# Patient Record
Sex: Male | Born: 2000 | Race: White | Hispanic: No | Marital: Single | Smoking: Former smoker
Health system: Southern US, Community
[De-identification: ages and names within clinical notes are randomized; demographics above are authoritative.]

## PROBLEM LIST (undated history)

## (undated) DIAGNOSIS — T7840XA Allergy, unspecified, initial encounter: Secondary | ICD-10-CM

## (undated) DIAGNOSIS — F909 Attention-deficit hyperactivity disorder, unspecified type: Secondary | ICD-10-CM

## (undated) DIAGNOSIS — T8859XA Other complications of anesthesia, initial encounter: Secondary | ICD-10-CM

## (undated) HISTORY — PX: HYDROCELE EXCISION: SHX482

## (undated) HISTORY — PX: HERNIA REPAIR: SHX51

---
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
## 2019-11-08 ENCOUNTER — Ambulatory Visit (INDEPENDENT_AMBULATORY_CARE_PROVIDER_SITE_OTHER): Payer: Self-pay | Admitting: Family Medicine

## 2019-11-08 ENCOUNTER — Other Ambulatory Visit: Payer: Self-pay

## 2019-11-08 ENCOUNTER — Encounter: Payer: Self-pay | Admitting: Family Medicine

## 2019-11-08 VITALS — BP 120/70 | HR 70 | Ht 74.0 in | Wt 178.0 lb

## 2019-11-08 DIAGNOSIS — Z0000 Encounter for general adult medical examination without abnormal findings: Secondary | ICD-10-CM

## 2019-11-08 NOTE — Progress Notes (Signed)
Date:  11/08/2019   Name:  Adam Gill   DOB:  [birth year]   MRN:  [id number]   Chief Complaint: Annual Exam  Patient is a 19 year old male who presents for a comprehensive physical exam. The patient reports the following problems: none. Health maintenance has been reviewed up to date   No results found for: CREATININE, BUN, NA, K, CL, CO2 No results found for: CHOL, HDL, LDLCALC, LDLDIRECT, TRIG, CHOLHDL No results found for: TSH No results found for: HGBA1C No results found for: WBC, HGB, HCT, MCV, PLT No results found for: ALT, AST, GGT, ALKPHOS, BILITOT   Review of Systems  Constitutional: Negative for chills and fever.  HENT: Negative for drooling, ear discharge, ear pain and sore throat.   Respiratory: Negative for cough, shortness of breath and wheezing.   Cardiovascular: Negative for chest pain, palpitations and leg swelling.  Gastrointestinal: Negative for abdominal pain, blood in stool, constipation, diarrhea and nausea.  Endocrine: Negative for polydipsia.  Genitourinary: Negative for dysuria, frequency, hematuria and urgency.  Musculoskeletal: Negative for back pain, myalgias and neck pain.  Skin: Negative for rash.  Allergic/Immunologic: Negative for environmental allergies.  Neurological: Negative for dizziness and headaches.  Hematological: Does not bruise/bleed easily.  Psychiatric/Behavioral: Negative for suicidal ideas. The patient is not nervous/anxious.     There are no problems to display for this patient.   No Known Allergies  Past Surgical History:  Procedure Laterality Date  . HERNIA REPAIR     AGE 43  . HYDROCELE EXCISION    . SHOULDER ARTHROSCOPY WITH BANKART REPAIR Left 09/23/2016   Procedure: SHOULDER ARTHROSCOPY WITH BANKART REPAIR;  Surgeon: Christena Flake, MD;  Location: [facility] ORS;  Service: Orthopedics;  Laterality: Left;  Marland Kitchen VARICOCELECTOMY  04/2016    Social History   Tobacco Use  . Smoking status: Current Every Day Smoker     Types: E-cigarettes  . Smokeless tobacco: Never Used  Vaping Use  . Vaping Use: Former  Substance Use Topics  . Alcohol use: No  . Drug use: No     Medication list has been reviewed and updated.  No outpatient medications have been marked as taking for the 11/08/19 encounter (Office Visit) with Duanne Limerick, MD.    [facility] 2/9 Scores 11/08/2019 03/24/2017  PHQ - 2 Score 0 0  PHQ- 9 Score 0 0    GAD 7 : Generalized Anxiety Score 11/08/2019  Nervous, Anxious, on Edge 0  Control/stop worrying 0  Worry too much - different things 0  Trouble relaxing 0  Restless 0  Easily annoyed or irritable 0  Afraid - awful might happen 0  Total GAD 7 Score 0  Anxiety Difficulty Not difficult at all    BP Readings from Last 3 Encounters:  11/08/19 120/70  03/24/17 120/70 (52 %, Z = 0.05 /  45 %, Z = -0.12)*  09/23/16 127/79 (77 %, Z = 0.75 /  82 %, Z = 0.91)*   *BP percentiles are based on the 2017 AAP Clinical Practice Guideline for boys    Physical Exam Vitals and nursing note reviewed.  Constitutional:      Appearance: Normal appearance. He is well-developed and well-groomed.  HENT:     Head: Normocephalic.     Jaw: There is normal jaw occlusion.     Right Ear: Hearing, tympanic membrane, ear canal and external ear normal.     Left Ear: Hearing, tympanic membrane, ear canal and external ear normal.  Nose: Nose normal.     Right Turbinates: Not enlarged or swollen.     Left Turbinates: Not enlarged or swollen.     Mouth/Throat:     Lips: Pink.     Mouth: Mucous membranes are moist.     Dentition: Normal dentition.     Tongue: No lesions.     Palate: No mass.     Pharynx: Oropharynx is clear. Uvula midline.  Eyes:     General: Lids are normal. Vision grossly intact. Gaze aligned appropriately. No scleral icterus.       Right eye: No discharge.        Left eye: No discharge.     Extraocular Movements:     Right eye: Normal extraocular motion.     Left eye: Normal  extraocular motion.     Conjunctiva/sclera: Conjunctivae normal.     Pupils: Pupils are equal, round, and reactive to light.     Funduscopic exam:    Right eye: Red reflex present.        Left eye: Red reflex present. Neck:     Thyroid: No thyroid mass, thyromegaly or thyroid tenderness.     Vascular: Normal carotid pulses. No carotid bruit, hepatojugular reflux or JVD.     Trachea: Trachea and phonation normal. No tracheal deviation.  Cardiovascular:     Rate and Rhythm: Normal rate and regular rhythm.     Chest Wall: PMI is not displaced.     Pulses: Normal pulses.          Carotid pulses are 2+ on the right side and 2+ on the left side.      Radial pulses are 2+ on the right side and 2+ on the left side.       Femoral pulses are 2+ on the right side and 2+ on the left side.      Popliteal pulses are 2+ on the right side and 2+ on the left side.       Dorsalis pedis pulses are 2+ on the right side and 2+ on the left side.       Posterior tibial pulses are 2+ on the right side and 2+ on the left side.     Heart sounds: Normal heart sounds, S1 normal and S2 normal. No murmur heard.  No systolic murmur is present.  No diastolic murmur is present.  No friction rub. No gallop. No S3 or S4 sounds.   Pulmonary:     Effort: Pulmonary effort is normal. No respiratory distress.     Breath sounds: Normal breath sounds. No decreased breath sounds, wheezing, rhonchi or rales.  Chest:     Breasts: Breasts are symmetrical.        Right: Normal.        Left: Normal.  Abdominal:     General: Abdomen is flat. Bowel sounds are normal.     Palpations: Abdomen is soft. There is no hepatomegaly, splenomegaly or mass.     Tenderness: There is no abdominal tenderness. There is no guarding or rebound.     Hernia: No hernia is present. There is no hernia in the umbilical area, ventral area, left inguinal area or right inguinal area.  Genitourinary:    Penis: Normal and circumcised.      Testes:         Right: Mass, tenderness, testicular hydrocele or varicocele not present.        Left: Varicocele present. Mass, tenderness or testicular hydrocele not present.  Epididymis:     Right: Normal.     Left: Normal.  Musculoskeletal:        General: No tenderness. Normal range of motion.     Cervical back: Normal, full passive range of motion without pain, normal range of motion and neck supple.     Thoracic back: Normal.     Lumbar back: Normal.     Right lower leg: No edema.     Left lower leg: No edema.     Right foot: Normal range of motion.     Left foot: Normal range of motion.  Feet:     Right foot:     Skin integrity: Skin integrity normal.     Left foot:     Skin integrity: Skin integrity normal.  Lymphadenopathy:     Head:     Right side of head: No submental, submandibular or tonsillar adenopathy.     Left side of head: No submental, submandibular or tonsillar adenopathy.     Cervical: No cervical adenopathy.     Right cervical: No superficial, deep or posterior cervical adenopathy.    Left cervical: No superficial, deep or posterior cervical adenopathy.     Upper Body:     Right upper body: No supraclavicular or axillary adenopathy.     Left upper body: No supraclavicular or axillary adenopathy.     Lower Body: No right inguinal adenopathy. No left inguinal adenopathy.  Skin:    General: Skin is warm.     Capillary Refill: Capillary refill takes less than 2 seconds.     Findings: No rash.  Neurological:     General: No focal deficit present.     Mental Status: He is alert and oriented to person, place, and time.     Cranial Nerves: Cranial nerves are intact. No cranial nerve deficit.     Sensory: Sensation is intact.     Motor: Motor function is intact.     Deep Tendon Reflexes: Reflexes are normal and symmetric.     Reflex Scores:      Tricep reflexes are 2+ on the right side and 2+ on the left side.      Bicep reflexes are 2+ on the right side and 2+ on the left  side.      Brachioradialis reflexes are 2+ on the right side and 2+ on the left side.      Patellar reflexes are 2+ on the right side and 2+ on the left side.      Achilles reflexes are 2+ on the right side and 2+ on the left side. Psychiatric:        Attention and Perception: Attention normal.        Mood and Affect: Mood and affect normal.        Speech: Speech normal.        Behavior: Behavior normal. Behavior is cooperative.        Cognition and Memory: Cognition normal.     Wt Readings from Last 3 Encounters:  11/08/19 178 lb (80.7 kg)  03/24/17 170 lb (77.1 kg) (81 %, Z= 0.89)*  09/23/16 159 lb (72.1 kg) (74 %, Z= 0.64)*   * Growth percentiles are based on CDC (Boys, 2-20 Years) data.    BP 120/70   Pulse 70   Ht 6\' 2"  (1.88 m)   Wt 178 lb (80.7 kg)   BMI 22.85 kg/m   Assessment and Plan: 1. Annual physical exam No subjective/objective concerns noted on  history and physical exam.  Patient's chart was reviewed and there is no concerns noted on previous encounters, most recent labs, most recent imaging, or care everywhere.Adam Gill is a 19 y.o. male who presents today for his Complete Annual Exam. He feels well. He reports exercising . He reports he is sleeping well. Immunizations are reviewed and recommendations provided.   Age appropriate screening tests are discussed. Counseling given for risk factor reduction interventions.  We will obtain a CMP and lipid panel. - Comprehensive metabolic panel - Lipid panel

## 2019-11-09 LAB — COMPREHENSIVE METABOLIC PANEL
ALT: 20 IU/L (ref 0–44)
AST: 29 IU/L (ref 0–40)
Albumin/Globulin Ratio: 1.9 (ref 1.2–2.2)
Albumin: 4.6 g/dL (ref 4.1–5.2)
Alkaline Phosphatase: 82 IU/L (ref 55–125)
BUN/Creatinine Ratio: 11 (ref 9–20)
BUN: 14 mg/dL (ref 6–20)
Bilirubin Total: 0.6 mg/dL (ref 0.0–1.2)
CO2: 27 mmol/L (ref 20–29)
Calcium: 9.8 mg/dL (ref 8.7–10.2)
Chloride: 105 mmol/L (ref 96–106)
Creatinine, Ser: 1.31 mg/dL — ABNORMAL HIGH (ref 0.76–1.27)
GFR calc Af Amer: 90 mL/min/{1.73_m2} (ref >59)
GFR calc non Af Amer: 78 mL/min/{1.73_m2} (ref >59)
Globulin, Total: 2.4 g/dL (ref 1.5–4.5)
Glucose: 71 mg/dL (ref 65–99)
Potassium: 4.1 mmol/L (ref 3.5–5.2)
Sodium: 145 mmol/L — ABNORMAL HIGH (ref 134–144)
Total Protein: 7 g/dL (ref 6.0–8.5)

## 2019-11-09 LAB — LIPID PANEL
Chol/HDL Ratio: 2.9 ratio (ref 0.0–5.0)
Cholesterol, Total: 123 mg/dL (ref 100–199)
HDL: 43 mg/dL (ref >39)
LDL Chol Calc (NIH): 70 mg/dL (ref 0–99)
Triglycerides: 43 mg/dL (ref 0–149)
VLDL Cholesterol Cal: 10 mg/dL (ref 5–40)

## 2021-01-07 ENCOUNTER — Encounter: Payer: Self-pay | Admitting: Family Medicine

## 2021-01-08 ENCOUNTER — Other Ambulatory Visit: Payer: Self-pay

## 2021-01-08 ENCOUNTER — Encounter: Payer: Self-pay | Admitting: Family Medicine

## 2021-01-08 ENCOUNTER — Ambulatory Visit (INDEPENDENT_AMBULATORY_CARE_PROVIDER_SITE_OTHER): Payer: Self-pay | Admitting: Family Medicine

## 2021-01-08 VITALS — BP 133/88 | HR 70 | Ht 74.0 in | Wt 188.6 lb

## 2021-01-08 DIAGNOSIS — Z0000 Encounter for general adult medical examination without abnormal findings: Secondary | ICD-10-CM

## 2021-01-08 NOTE — Progress Notes (Signed)
Date:  01/08/2021   Name:  Adam Gill   DOB:  [birth year]   MRN:  [id number]   Chief Complaint: No chief complaint on file.  Patient is a 20 year old male who presents for a comprehensive physical exam. The patient reports the following problems: none. Health maintenance has been reviewed up to date.Adam Gill is a 20 y.o. male who presents today for his Complete Annual Exam. He feels well. He reports exercising every day. He reports he is sleeping well.       Lab Results  Component Value Date   CREATININE 1.31 (H) 11/08/2019   BUN 14 11/08/2019   NA 145 (H) 11/08/2019   K 4.1 11/08/2019   CL 105 11/08/2019   CO2 27 11/08/2019   Lab Results  Component Value Date   CHOL 123 11/08/2019   HDL 43 11/08/2019   LDLCALC 70 11/08/2019   TRIG 43 11/08/2019   CHOLHDL 2.9 11/08/2019   No results found for: TSH No results found for: HGBA1C No results found for: WBC, HGB, HCT, MCV, PLT Lab Results  Component Value Date   ALT 20 11/08/2019   AST 29 11/08/2019   ALKPHOS 82 11/08/2019   BILITOT 0.6 11/08/2019     Review of Systems  Constitutional:  Negative for chills and fever.  HENT:  Positive for hearing loss. Negative for drooling, ear discharge, ear pain and sore throat.   Eyes:  Negative for visual disturbance.  Respiratory:  Negative for cough, shortness of breath and wheezing.   Cardiovascular:  Negative for chest pain, palpitations and leg swelling.  Gastrointestinal:  Negative for abdominal pain, blood in stool, constipation, diarrhea and nausea.  Endocrine: Negative for polydipsia.  Genitourinary:  Negative for dysuria, frequency, hematuria and urgency.  Musculoskeletal:  Negative for back pain, myalgias and neck pain.  Skin:  Negative for rash.  Allergic/Immunologic: Negative for environmental allergies.  Neurological:  Negative for dizziness and headaches.  Hematological:  Negative for adenopathy. Does not bruise/bleed easily.  Psychiatric/Behavioral:   Negative for suicidal ideas. The patient is not nervous/anxious.    There are no problems to display for this patient.   No Known Allergies  Past Surgical History:  Procedure Laterality Date   HERNIA REPAIR     AGE 58   HYDROCELE EXCISION     SHOULDER ARTHROSCOPY WITH BANKART REPAIR Left 09/23/2016   Procedure: SHOULDER ARTHROSCOPY WITH BANKART REPAIR;  Surgeon: Christena Flake, MD;  Location: [facility] ORS;  Service: Orthopedics;  Laterality: Left;   VARICOCELECTOMY  04/2016    Social History   Tobacco Use   Smoking status: Every Day    Types: E-cigarettes   Smokeless tobacco: Never  Vaping Use   Vaping Use: Former  Substance Use Topics   Alcohol use: No   Drug use: No     Medication list has been reviewed and updated.  No outpatient medications have been marked as taking for the 01/08/21 encounter (Appointment) with Duanne Limerick, MD.    [facility] 2/9 Scores 11/08/2019 03/24/2017  PHQ - 2 Score 0 0  PHQ- 9 Score 0 0    GAD 7 : Generalized Anxiety Score 11/08/2019  Nervous, Anxious, on Edge 0  Control/stop worrying 0  Worry too much - different things 0  Trouble relaxing 0  Restless 0  Easily annoyed or irritable 0  Afraid - awful might happen 0  Total GAD 7 Score 0  Anxiety Difficulty Not difficult at all  BP Readings from Last 3 Encounters:  11/08/19 120/70  03/24/17 120/70 (54 %, Z = 0.10 /  48 %, Z = -0.05)*  09/23/16 127/79 (78 %, Z = 0.77 /  83 %, Z = 0.95)*   *BP percentiles are based on the 2017 AAP Clinical Practice Guideline for boys    Physical Exam Vitals and nursing note reviewed.  Constitutional:      Appearance: He is well-developed, well-groomed and normal weight.  HENT:     Head: Normocephalic.     Jaw: There is normal jaw occlusion.     Right Ear: Hearing, tympanic membrane, ear canal and external ear normal.     Left Ear: Hearing, tympanic membrane, ear canal and external ear normal.     Nose: Nose normal. No congestion or rhinorrhea.      Right Turbinates: Not enlarged.     Left Turbinates: Not enlarged.  Eyes:     General: Lids are normal. Vision grossly intact. Gaze aligned appropriately. No scleral icterus.       Right eye: No discharge.        Left eye: No discharge.     Conjunctiva/sclera: Conjunctivae normal.     Pupils: Pupils are equal, round, and reactive to light.     Funduscopic exam:    Right eye: Red reflex present.        Left eye: Red reflex present. Neck:     Thyroid: No thyroid mass, thyromegaly or thyroid tenderness.     Vascular: Normal carotid pulses. No carotid bruit, hepatojugular reflux or JVD.     Trachea: Trachea normal. No tracheal tenderness or tracheal deviation.  Cardiovascular:     Rate and Rhythm: Normal rate and regular rhythm.     Chest Wall: PMI is not displaced.     Pulses: Normal pulses.          Carotid pulses are 2+ on the right side and 2+ on the left side.      Radial pulses are 2+ on the right side and 2+ on the left side.       Femoral pulses are 2+ on the right side and 2+ on the left side.      Popliteal pulses are 2+ on the right side and 2+ on the left side.       Dorsalis pedis pulses are 2+ on the right side and 2+ on the left side.       Posterior tibial pulses are 2+ on the right side and 2+ on the left side.     Heart sounds: Normal heart sounds, S1 normal and S2 normal. No murmur heard. No systolic murmur is present.  No diastolic murmur is present.    No friction rub. No gallop. No S3 or S4 sounds.  Pulmonary:     Effort: Pulmonary effort is normal. No respiratory distress.     Breath sounds: Normal breath sounds and air entry. No decreased breath sounds, wheezing, rhonchi or rales.  Chest:  Breasts:    Right: Normal. No swelling, bleeding, inverted nipple, mass, nipple discharge, skin change or tenderness.     Left: Normal. No swelling, bleeding, inverted nipple, mass, nipple discharge, skin change or tenderness.  Abdominal:     General: Bowel sounds are  normal.     Palpations: Abdomen is soft. There is no hepatomegaly, splenomegaly or mass.     Tenderness: There is no abdominal tenderness. There is no guarding or rebound.     Hernia: No hernia  is present. There is no hernia in the umbilical area, ventral area, left inguinal area or right inguinal area.  Genitourinary:    Testes:        Right: Varicocele present. Mass, tenderness, swelling or testicular hydrocele not present.        Left: Mass, tenderness, swelling, testicular hydrocele or varicocele not present.     Epididymis:     Right: Enlarged. Tenderness present. No mass.     Left: Normal. No mass.     Comments: Right testes elevated Musculoskeletal:        General: No tenderness. Normal range of motion.     Cervical back: Normal, full passive range of motion without pain, normal range of motion and neck supple.     Thoracic back: Normal.     Lumbar back: Normal.     Right lower leg: No edema.     Left lower leg: No edema.  Lymphadenopathy:     Head:     Right side of head: No submental, submandibular or tonsillar adenopathy.     Left side of head: No submental, submandibular or tonsillar adenopathy.     Cervical: No cervical adenopathy.     Right cervical: No superficial, deep or posterior cervical adenopathy.    Left cervical: No superficial, deep or posterior cervical adenopathy.     Upper Body:     Right upper body: No supraclavicular, axillary or pectoral adenopathy.     Left upper body: No supraclavicular, axillary or pectoral adenopathy.     Lower Body: No right inguinal adenopathy. No left inguinal adenopathy.  Skin:    General: Skin is warm.     Capillary Refill: Capillary refill takes less than 2 seconds.     Findings: No bruising, lesion or rash.  Neurological:     Mental Status: He is alert and oriented to person, place, and time.     Cranial Nerves: Cranial nerves are intact. No cranial nerve deficit.     Sensory: Sensation is intact.     Motor: Motor function  is intact.     Deep Tendon Reflexes: Reflexes are normal and symmetric.  Psychiatric:        Behavior: Behavior is cooperative.    Wt Readings from Last 3 Encounters:  11/08/19 178 lb (80.7 kg)  03/24/17 170 lb (77.1 kg) (81 %, Z= 0.89)*  09/23/16 159 lb (72.1 kg) (74 %, Z= 0.64)*   * Growth percentiles are based on CDC (Boys, 2-20 Years) data.    There were no vitals taken for this visit.  Assessment and Plan: Adam Gill is a 20 y.o. male who presents today for his Complete Annual Exam. He feels well. He reports exercising every day. He reports he is sleeping well.  Patient's chart was reviewed for previous encounters most recent labs most recent imaging in Care Everywhere. 1. Annual physical exam Immunizations are reviewed and recommendations provided.   Age appropriate screening tests are discussed. Counseling given for risk factor reduction interventions.  No subjective/objective concerns noted during HPI/review of systems/physical exam other than urologic concerns addressed noted.  Will obtain CMP lipid panel HIV and hep C titers. - Comprehensive metabolic panel. - Lipid panel - HIV Antibody (routine testing w rflx) - Hepatitis C antibody

## 2021-01-09 LAB — COMPREHENSIVE METABOLIC PANEL
ALT: 18 IU/L (ref 0–44)
AST: 29 IU/L (ref 0–40)
Albumin/Globulin Ratio: 2.3 — ABNORMAL HIGH (ref 1.2–2.2)
Albumin: 4.9 g/dL (ref 4.1–5.2)
Alkaline Phosphatase: 97 IU/L (ref 44–121)
BUN/Creatinine Ratio: 16 (ref 9–20)
BUN: 21 mg/dL — ABNORMAL HIGH (ref 6–20)
Bilirubin Total: 0.7 mg/dL (ref 0.0–1.2)
CO2: 26 mmol/L (ref 20–29)
Calcium: 9.7 mg/dL (ref 8.7–10.2)
Chloride: 103 mmol/L (ref 96–106)
Creatinine, Ser: 1.31 mg/dL — ABNORMAL HIGH (ref 0.76–1.27)
Globulin, Total: 2.1 g/dL (ref 1.5–4.5)
Glucose: 69 mg/dL (ref 65–99)
Potassium: 4.4 mmol/L (ref 3.5–5.2)
Sodium: 142 mmol/L (ref 134–144)
Total Protein: 7 g/dL (ref 6.0–8.5)
eGFR: 79 mL/min/{1.73_m2} (ref >59)

## 2021-01-09 LAB — LIPID PANEL
Chol/HDL Ratio: 3.2 ratio (ref 0.0–5.0)
Cholesterol, Total: 119 mg/dL (ref 100–199)
HDL: 37 mg/dL — ABNORMAL LOW (ref >39)
LDL Chol Calc (NIH): 58 mg/dL (ref 0–99)
Triglycerides: 138 mg/dL (ref 0–149)
VLDL Cholesterol Cal: 24 mg/dL (ref 5–40)

## 2021-01-09 LAB — HEPATITIS C ANTIBODY: Hep C Virus Ab: <0.1 s/co ratio (ref 0.0–0.9)

## 2021-01-09 LAB — HIV ANTIBODY (ROUTINE TESTING W REFLEX): HIV Screen 4th Generation wRfx: NONREACTIVE

## 2021-06-23 ENCOUNTER — Encounter: Payer: Self-pay | Admitting: Family Medicine

## 2021-06-23 ENCOUNTER — Other Ambulatory Visit: Payer: Self-pay

## 2021-06-23 ENCOUNTER — Ambulatory Visit (INDEPENDENT_AMBULATORY_CARE_PROVIDER_SITE_OTHER): Admitting: Family Medicine

## 2021-06-23 VITALS — BP 130/86 | HR 71 | Resp 98 | Ht 74.0 in | Wt 186.0 lb

## 2021-06-23 DIAGNOSIS — I861 Scrotal varices: Secondary | ICD-10-CM | POA: Diagnosis not present

## 2021-06-23 NOTE — Progress Notes (Signed)
Date:  06/23/2021   Name:  Adam Gill   DOB:  2000-06-16   MRN:  [id number]   Chief Complaint: Testicle Pain (Left Side. Getting Worse. )  Testicle Pain The patient's primary symptoms include scrotal swelling and testicular pain. The patient's pertinent negatives include no genital injury, genital lesions, penile discharge or penile pain. This is a recurrent problem. The current episode started more than 1 year ago (about a year). The problem occurs constantly. The problem has been waxing and waning. The pain is severe. Pertinent negatives include no abdominal pain, anorexia, chest pain, chills, constipation, coughing, diarrhea, dysuria, fever, flank pain, frequency, headaches, nausea, painful intercourse, rash, shortness of breath, sore throat or urgency. The testicular pain affects the left testicle. There is swelling in the left testicle. The symptoms are aggravated by activity and tactile pressure. Treatments tried: surgery x 2/ The treatment provided mild relief.   Lab Results  Component Value Date   NA 142 01/08/2021   K 4.4 01/08/2021   CO2 26 01/08/2021   GLUCOSE 69 01/08/2021   BUN 21 (H) 01/08/2021   CREATININE 1.31 (H) 01/08/2021   CALCIUM 9.7 01/08/2021   EGFR 79 01/08/2021   GFRNONAA 78 11/08/2019   Lab Results  Component Value Date   CHOL 119 01/08/2021   HDL 37 (L) 01/08/2021   LDLCALC 58 01/08/2021   TRIG 138 01/08/2021   CHOLHDL 3.2 01/08/2021   No results found for: TSH No results found for: HGBA1C No results found for: WBC, HGB, HCT, MCV, PLT Lab Results  Component Value Date   ALT 18 01/08/2021   AST 29 01/08/2021   ALKPHOS 97 01/08/2021   BILITOT 0.7 01/08/2021   No results found for: 25OHVITD2, 25OHVITD3, VD25OH   Review of Systems  Constitutional:  Negative for chills and fever.  HENT:  Negative for drooling, ear discharge, ear pain and sore throat.   Respiratory:  Negative for cough, shortness of breath and wheezing.   Cardiovascular:   Negative for chest pain, palpitations and leg swelling.  Gastrointestinal:  Negative for abdominal pain, anorexia, blood in stool, constipation, diarrhea and nausea.  Endocrine: Negative for polydipsia.  Genitourinary:  Positive for scrotal swelling and testicular pain. Negative for difficulty urinating, dysuria, enuresis, flank pain, frequency, genital sores, hematuria, penile discharge, penile pain, penile swelling and urgency.  Musculoskeletal:  Negative for back pain, myalgias and neck pain.  Skin:  Negative for rash.  Allergic/Immunologic: Negative for environmental allergies.  Neurological:  Negative for dizziness and headaches.  Hematological:  Does not bruise/bleed easily.  Psychiatric/Behavioral:  Negative for suicidal ideas. The patient is not nervous/anxious.    There are no problems to display for this patient.   No Known Allergies  Past Surgical History:  Procedure Laterality Date   HERNIA REPAIR     AGE 53   HYDROCELE EXCISION     SHOULDER ARTHROSCOPY WITH BANKART REPAIR Left 09/23/2016   Procedure: SHOULDER ARTHROSCOPY WITH BANKART REPAIR;  Surgeon: Corky Mull, MD;  Location: [facility] ORS;  Service: Orthopedics;  Laterality: Left;   VARICOCELECTOMY  04/2016    Social History   Tobacco Use   Smoking status: Former    Types: E-cigarettes   Smokeless tobacco: Never  Vaping Use   Vaping Use: Former  Substance Use Topics   Alcohol use: No   Drug use: No     Medication list has been reviewed and updated.  No outpatient medications have been marked as taking for the 06/23/21  encounter (Office Visit) with Juline Patch, MD.    [facility] 2/9 Scores 06/23/2021 01/08/2021 11/08/2019 03/24/2017  PHQ - 2 Score 0 0 0 0  PHQ- 9 Score 3 0 0 0    GAD 7 : Generalized Anxiety Score 06/23/2021 01/08/2021 11/08/2019  Nervous, Anxious, on Edge 0 0 0  Control/stop worrying 0 0 0  Worry too much - different things 0 0 0  Trouble relaxing 0 0 0  Restless 0 0 0  Easily annoyed or  irritable 0 0 0  Afraid - awful might happen 0 0 0  Total GAD 7 Score 0 0 0  Anxiety Difficulty Not difficult at all Not difficult at all Not difficult at all    BP Readings from Last 3 Encounters:  06/23/21 130/86  01/08/21 133/88  11/08/19 120/70    Physical Exam Vitals and nursing note reviewed.  HENT:     Head: Normocephalic.     Right Ear: Tympanic membrane and external ear normal.     Left Ear: Tympanic membrane and external ear normal.     Nose: Nose normal. No congestion or rhinorrhea.  Eyes:     General: No scleral icterus.       Right eye: No discharge.        Left eye: No discharge.     Conjunctiva/sclera: Conjunctivae normal.     Pupils: Pupils are equal, round, and reactive to light.  Neck:     Thyroid: No thyromegaly.     Vascular: No JVD.     Trachea: No tracheal deviation.  Cardiovascular:     Rate and Rhythm: Normal rate and regular rhythm.     Heart sounds: Normal heart sounds. No murmur heard.   No friction rub. No gallop.  Pulmonary:     Effort: No respiratory distress.     Breath sounds: Normal breath sounds. No wheezing, rhonchi or rales.  Abdominal:     General: Bowel sounds are normal.     Palpations: Abdomen is soft. There is no mass.     Tenderness: There is no abdominal tenderness. There is no guarding or rebound.  Genitourinary:    Testes:        Right: Varicocele not present.        Left: Tenderness, swelling and varicocele present. Mass not present.  Musculoskeletal:        General: No tenderness. Normal range of motion.     Cervical back: Normal range of motion and neck supple.  Lymphadenopathy:     Cervical: No cervical adenopathy.  Skin:    General: Skin is warm.     Findings: No bruising, erythema or rash.  Neurological:     Mental Status: He is alert and oriented to person, place, and time.     Cranial Nerves: No cranial nerve deficit.     Deep Tendon Reflexes: Reflexes are normal and symmetric.    Wt Readings from Last 3  Encounters:  06/23/21 186 lb (84.4 kg)  01/08/21 188 lb 9.6 oz (85.5 kg)  11/08/19 178 lb (80.7 kg)    BP 130/86    Pulse 71    Resp (!) 98    Ht _0  (1.88 m)    Wt 186 lb (84.4 kg)    BMI 23.88 kg/m   Assessment and Plan:  1. Varicocele New reoccurrence.  Persistent.  Degree of pain 8/10.  Over the past year this is gradually increased to the point that there is no resolution and that  pain has unresolvable with nonsteroidal anti-inflammatory.  Patient has had 2 previous surgeries and this is a recurrence of the original condition.  We will refer to urology for evaluation and treatment thereof.  In the meantime continue nonsteroidal anti-inflammatory and compression will also help with jockey like underwear. - Ambulatory referral to Urology

## 2021-07-24 ENCOUNTER — Other Ambulatory Visit: Payer: Self-pay | Admitting: Urology

## 2021-07-24 DIAGNOSIS — I861 Scrotal varices: Secondary | ICD-10-CM

## 2021-07-24 DIAGNOSIS — N50812 Left testicular pain: Secondary | ICD-10-CM

## 2021-07-28 ENCOUNTER — Other Ambulatory Visit: Payer: Self-pay

## 2021-07-30 ENCOUNTER — Other Ambulatory Visit: Payer: Self-pay

## 2021-08-03 ENCOUNTER — Other Ambulatory Visit: Payer: Self-pay

## 2021-08-18 ENCOUNTER — Inpatient Hospital Stay: Admission: RE | Admit: 2021-08-18 | Payer: Self-pay | Source: Ambulatory Visit

## 2021-08-18 HISTORY — DX: Other complications of anesthesia, initial encounter: T88.59XA

## 2021-08-20 ENCOUNTER — Ambulatory Visit
Admission: RE | Admit: 2021-08-20 | Discharge: 2021-08-20 | Disposition: A | Discharge status: Home / Self Care | Source: Ambulatory Visit | Attending: Urology | Admitting: Urology

## 2021-08-20 DIAGNOSIS — I861 Scrotal varices: Secondary | ICD-10-CM

## 2021-08-20 DIAGNOSIS — N50812 Left testicular pain: Secondary | ICD-10-CM

## 2021-08-20 NOTE — Consult Note (Signed)
? ?Chief Complaint: ?Patient was seen in consultation today for left varicocele ? ?Referring Physician(s): ?Machen,Graham L ? ?History of Present Illness: ?Adam Gill is a 21 y.o. male with history of left varicocele status post prior surgical interventions at age 23 and age 84.  He states that neither of these were successful, and he has had persistent left testicular pain and swelling since age 85.  The pain comes and goes.  He endorses worsening of symptoms when working out heavily including squats.  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  He has not tried anything to relieve the symptoms except his prior operations.  He has never had a semen analysis.  He denies any scrotal skin discoloration.  He has never had a semen analysis performed, but is motivated to have this prior to any further intervention.  He denies any prior imaging of his scrotum or abdomen/pelvis. ? ?Past Medical History:  ?Diagnosis Date  ? ADHD (attention deficit hyperactivity disorder)   ? Allergy   ? Complication of anesthesia   ? ? ?Past Surgical History:  ?Procedure Laterality Date  ? HERNIA REPAIR    ? AGE 10  ? HYDROCELE EXCISION    ? SHOULDER ARTHROSCOPY WITH BANKART REPAIR Left 09/23/2016  ? Procedure: SHOULDER ARTHROSCOPY WITH BANKART REPAIR;  Surgeon: Christena Flake, MD;  Location: [facility] ORS;  Service: Orthopedics;  Laterality: Left;  ? VARICOCELECTOMY  04/2016  ? ? ?Allergies: ?Patient has no known allergies. ? ?Medications: ?Prior to Admission medications   ?Not on File  ?  ? ?No family history on file. ? ?Social History  ? ?Socioeconomic History  ? Marital status: Single  ?  Spouse name: Not on file  ? Number of children: Not on file  ? Years of education: Not on file  ? Highest education level: Not on file  ?Occupational History  ? Not on file  ?Tobacco Use  ? Smoking status: Former  ?  Types: E-cigarettes  ? Smokeless tobacco: Never  ?Vaping Use  ? Vaping Use: Former   ?Substance and Sexual Activity  ? Alcohol use: No  ? Drug use: No  ? Sexual activity: Not on file  ?Other Topics Concern  ? Not on file  ?Social History Narrative  ? Not on file  ? ?Social Determinants of Health  ? ?Financial Resource Strain: Not on file  ?Food Insecurity: Not on file  ?Transportation Needs: Not on file  ?Physical Activity: Not on file  ?Stress: Not on file  ?Social Connections: Not on file  ? ? ?Review of Systems: A 12 point ROS discussed and pertinent positives are indicated in the HPI above.  All other systems are negative. ? ?Vital Signs: ?BP 122/69 (BP Location: Right Arm)   Pulse 69   Temp 98.1 ?F (36.7 ?C) (Oral)   Resp 16   Wt 86.2 kg   SpO2 96%   BMI 24.39 kg/m?  ? ?Physical Exam ?Constitutional:   ?   General: He is not in acute distress. ?HENT:  ?   Head: Normocephalic.  ?   Mouth/Throat:  ?   Mouth: Mucous membranes are moist.  ?Cardiovascular:  ?   Rate and Rhythm: Normal rate and regular rhythm.  ?   Heart sounds: Normal heart sounds.  ?Pulmonary:  ?   Breath sounds: Normal breath sounds.  ?Abdominal:  ?   General: There is no distension.  ?Skin: ?   General: Skin  is warm and dry.  ?Neurological:  ?   Mental Status: He is alert and oriented to person, place, and time.  ? ? ?Imaging: ?No results found. ? ?Labs: ? ?CBC: ?No results for input(s): WBC, HGB, HCT, PLT in the last 8760 hours. ? ?COAGS: ?No results for input(s): INR, APTT in the last 8760 hours. ? ?BMP: ?Recent Labs  ?  01/08/21 ?1158  ?NA 142  ?K 4.4  ?CL 103  ?CO2 26  ?GLUCOSE 69  ?BUN 21*  ?CALCIUM 9.7  ?CREATININE 1.31*  ? ? ?LIVER FUNCTION TESTS: ?Recent Labs  ?  01/08/21 ?1158  ?BILITOT 0.7  ?AST 29  ?ALT 18  ?ALKPHOS 97  ?PROT 7.0  ?ALBUMIN 4.9  ? ? ?TUMOR MARKERS: ?No results for input(s): AFPTM, CEA, CA199, CHROMGRNA in the last 8760 hours. ? ?Assessment and Plan: ?21 year old male with symptomatic left varicocele status post prior urologic procedures at age 62 and 82 which have not been successful.  We  discussed the natural history and of varicocele, treatment options including varicocele embolization, expected outcomes, and comparative data with varicocelectomy.   ? ?He would like to proceed with varicocele embolization.  He requests a semen analysis prior to intervention, which I support.  Additionally, I will order a CTV abdomen/pelvis for planning purposes. ? ?Once these are obtained, plan for image guided left varicocele embolization at [facility] [place] with moderate sedation.  Plan for same day discharge home. ? ? ? ?Electronically Signed: ?Bennie Dallas, MD ?08/20/2021, 3:40 PM ? ? ?I spent a total of  40 Minutes  in face to face in clinical consultation, greater than 50% of which was counseling/coordinating care for symptomatic varicocele. ? ?

## 2022-01-12 ENCOUNTER — Encounter: Payer: Self-pay | Admitting: Family Medicine

## 2022-09-22 ENCOUNTER — Telehealth: Payer: Self-pay

## 2022-09-22 NOTE — Transitions of Care (Post Inpatient/ED Visit) (Signed)
   09/22/2022  Name: Adam Gill MRN: [id number] DOB: Oct 01, 2000  Today's TOC FU Call Status: Today's TOC FU Call Status:: Unsuccessul Call (1st Attempt) Unsuccessful Call (1st Attempt) Date: 09/22/22  Attempted to reach the patient regarding the most recent Inpatient/ED visit.  Follow Up Plan: Additional outreach attempts will be made to reach the patient to complete the Transitions of Care (Post Inpatient/ED visit) call.   [employer], CMA

## 2022-09-23 ENCOUNTER — Telehealth: Payer: Self-pay | Admitting: Family Medicine

## 2022-09-23 NOTE — Telephone Encounter (Signed)
Copied from CRM [id number]. Topic: Appointment Scheduling - Scheduling Inquiry for Clinic >> Sep 23, 2022  4:18 PM Turkey B wrote: Reason for CRM: pt's fiance called in states pt has already scheduled a hops fu with a different Dr, because we coldnt help with his situaiton

## 2022-09-23 NOTE — Transitions of Care (Post Inpatient/ED Visit) (Signed)
   09/23/2022  Name: Adam Gill MRN: [id number] DOB: 2001/03/26  Today's TOC FU Call Status: Today's TOC FU Call Status:: Unsuccessful Call (2nd Attempt) Unsuccessful Call (1st Attempt) Date: 09/22/22 Unsuccessful Call (2nd Attempt) Date: 09/23/22  Attempted to reach the patient regarding the most recent Inpatient/ED visit.  Follow Up Plan: Additional outreach attempts will be made to reach the patient to complete the Transitions of Care (Post Inpatient/ED visit) call.   [employer], CMA

## 2022-09-24 NOTE — Transitions of Care (Post Inpatient/ED Visit) (Signed)
   09/24/2022  Name: Adam Gill MRN: [id number] DOB: 12-Oct-2000  Today's TOC FU Call Status: Today's TOC FU Call Status:: Unsuccessful Call (3rd Attempt) Unsuccessful Call (1st Attempt) Date: 09/22/22 Unsuccessful Call (2nd Attempt) Date: 09/23/22 Unsuccessful Call (3rd Attempt) Date: 09/24/22  Attempted to reach the patient regarding the most recent Inpatient/ED visit.  Follow Up Plan: No further outreach attempts will be made at this time. We have been unable to contact the patient.  [employer], CMA

## 2022-09-24 NOTE — Telephone Encounter (Signed)
Called pt fiance answered phone. Pt was not there. Called for [facility] call. 3 rd attempt unsuccessful call.  KP

## 2022-10-13 ENCOUNTER — Ambulatory Visit (INDEPENDENT_AMBULATORY_CARE_PROVIDER_SITE_OTHER): Payer: Self-pay | Admitting: Family Medicine

## 2022-10-13 ENCOUNTER — Encounter: Payer: Self-pay | Admitting: Family Medicine

## 2022-10-13 VITALS — BP 124/70 | HR 72 | Ht 74.0 in | Wt 200.0 lb

## 2022-10-13 DIAGNOSIS — L0100 Impetigo, unspecified: Secondary | ICD-10-CM

## 2022-10-13 MED ORDER — DOXYCYCLINE HYCLATE 100 MG PO TABS: 100.0000 mg | ORAL_TABLET | Freq: Two times a day (BID) | ORAL | 0 refills | Status: DC

## 2022-10-13 MED ORDER — MUPIROCIN 2 % EX OINT: 1.0000 | TOPICAL_OINTMENT | Freq: Two times a day (BID) | CUTANEOUS | 0 refills | Status: DC

## 2022-10-13 NOTE — Progress Notes (Signed)
Date:  10/13/2022   Name:  Adam Gill   DOB:  January 31, 2001   MRN:  [id number]   Chief Complaint: Rash (Started 2 days ago. Used someone elses razor and now has rash. Itching. Painful. Rash is located on face, and right armpit)  Rash This is a new problem. The current episode started in the past 7 days. The problem is unchanged. The affected locations include the face. The rash is characterized by redness, burning and itchiness. Associated with: razor burn. Pertinent negatives include no shortness of breath.    Lab Results  Component Value Date   NA 142 01/08/2021   K 4.4 01/08/2021   CO2 26 01/08/2021   GLUCOSE 69 01/08/2021   BUN 21 (H) 01/08/2021   CREATININE 1.31 (H) 01/08/2021   CALCIUM 9.7 01/08/2021   EGFR 79 01/08/2021   GFRNONAA 78 11/08/2019   Lab Results  Component Value Date   CHOL 119 01/08/2021   HDL 37 (L) 01/08/2021   LDLCALC 58 01/08/2021   TRIG 138 01/08/2021   CHOLHDL 3.2 01/08/2021   No results found for: "TSH" No results found for: "HGBA1C" No results found for: "WBC", "HGB", "HCT", "MCV", "PLT" Lab Results  Component Value Date   ALT 18 01/08/2021   AST 29 01/08/2021   ALKPHOS 97 01/08/2021   BILITOT 0.7 01/08/2021   No results found for: "25OHVITD2", "25OHVITD3", "VD25OH"   Review of Systems  HENT:  Negative for facial swelling.   Respiratory:  Negative for chest tightness and shortness of breath.   Cardiovascular:  Negative for chest pain and palpitations.  Musculoskeletal:  Negative for myalgias.  Skin:  Positive for rash. Negative for color change, pallor and wound.    There are no problems to display for this patient.   No Known Allergies  Past Surgical History:  Procedure Laterality Date   HERNIA REPAIR     AGE 22   HYDROCELE EXCISION     SHOULDER ARTHROSCOPY WITH BANKART REPAIR Left 09/23/2016   Procedure: SHOULDER ARTHROSCOPY WITH BANKART REPAIR;  Surgeon: Christena Flake, MD;  Location: [facility];  Service: Orthopedics;   Laterality: Left;   VARICOCELECTOMY  04/2016    Social History   Tobacco Use   Smoking status: Former    Types: E-cigarettes   Smokeless tobacco: Never  Vaping Use   Vaping Use: Former  Substance Use Topics   Alcohol use: No   Drug use: No     Medication list has been reviewed and updated.  No outpatient medications have been marked as taking for the 10/13/22 encounter (Office Visit) with Duanne Limerick, MD.       10/13/2022   10:08 AM 06/23/2021    9:08 AM 01/08/2021   10:40 AM 11/08/2019    7:56 AM  GAD 7 : Generalized Anxiety Score  Nervous, Anxious, on Edge 2 0 0 0  Control/stop worrying 2 0 0 0  Worry too much - different things 2 0 0 0  Trouble relaxing 3 0 0 0  Restless 3 0 0 0  Easily annoyed or irritable 3 0 0 0  Afraid - awful might happen 0 0 0 0  Total GAD 7 Score 15 0 0 0  Anxiety Difficulty Very difficult Not difficult at all Not difficult at all Not difficult at all       10/13/2022   10:08 AM 06/23/2021    9:07 AM 01/08/2021   10:39 AM  Depression screen PHQ 2/9  Decreased  Interest 3 0 0  Down, Depressed, Hopeless 3 0 0  PHQ - 2 Score 6 0 0  Altered sleeping 3 3 0  Tired, decreased energy 0 0 0  Change in appetite 0 0 0  Feeling bad or failure about yourself  3 0 0  Trouble concentrating 0 0 0  Moving slowly or fidgety/restless 0 0 0  Suicidal thoughts 0 0 0  PHQ-9 Score 12 3 0  Difficult doing work/chores Somewhat difficult Not difficult at all Not difficult at all    BP Readings from Last 3 Encounters:  10/13/22 124/70  08/20/21 122/69  06/23/21 130/86    Physical Exam Vitals and nursing note reviewed.  Neurological:     Mental Status: He is alert.     Wt Readings from Last 3 Encounters:  10/13/22 200 lb (90.7 kg)  08/20/21 190 lb (86.2 kg)  06/23/21 186 lb (84.4 kg)    BP 124/70   Pulse 72   Ht 6\' 2"  (1.88 m)   Wt 200 lb (90.7 kg)   SpO2 97%   BMI 25.68 kg/m   Assessment and Plan:     Elizabeth Sauer, MD

## 2023-06-01 ENCOUNTER — Ambulatory Visit (INDEPENDENT_AMBULATORY_CARE_PROVIDER_SITE_OTHER): Payer: Self-pay | Admitting: Family Medicine

## 2023-06-01 ENCOUNTER — Encounter: Payer: Self-pay | Admitting: Family Medicine

## 2023-06-01 VITALS — BP 120/70 | HR 78 | Ht 74.0 in | Wt 194.0 lb

## 2023-06-01 DIAGNOSIS — J069 Acute upper respiratory infection, unspecified: Secondary | ICD-10-CM

## 2023-06-01 LAB — POCT RAPID STREP A

## 2023-06-01 MED ORDER — PROMETHAZINE-DM 6.25-15 MG/5ML PO SYRP: 5.0000 mL | ORAL_SOLUTION | Freq: Four times a day (QID) | ORAL | 0 refills | Status: AC | PRN

## 2023-06-01 MED ORDER — ALBUTEROL SULFATE HFA 108 (90 BASE) MCG/ACT IN AERS: 2.0000 | INHALATION_SPRAY | Freq: Four times a day (QID) | RESPIRATORY_TRACT | 0 refills | Status: AC | PRN

## 2023-06-01 MED ORDER — AIRSUPRA 90-80 MCG/ACT IN AERO: 2.0000 | INHALATION_SPRAY | RESPIRATORY_TRACT | 1 refills | Status: AC

## 2023-06-01 MED ORDER — AZITHROMYCIN 250 MG PO TABS: ORAL_TABLET | ORAL | 0 refills | Status: AC

## 2023-06-01 NOTE — Progress Notes (Signed)
 Primary Care / Sports Medicine Office Visit  Patient Information:  Patient ID: Adam Gill, male DOB: 07-03-2000 Age: 23 y.o. MRN: [id number]   Adam Gill is a pleasant 23 y.o. male presenting with the following:  Chief Complaint  Patient presents with   Sore Throat    X 2 weeks, difficulty swallowing     Vitals:   06/01/23 0917  BP: 120/70  Pulse: 78  SpO2: 96%   Vitals:   06/01/23 0917  Weight: 194 lb (88 kg)  Height: 6\' 2"  (1.88 m)   Body mass index is 24.91 kg/m.  No results found.   Independent interpretation of notes and tests performed by another provider:   None  Procedures performed:   None  Pertinent History, Exam, Impression, and Recommendations:   Problem List Items Addressed This Visit     Upper respiratory tract infection - Primary   History of Present Illness Adam Gill, a patient with a history of childhood asthma, presents with a 1-2 week-long history of coughing, throat pain, and hoarseness. The coughing was the first symptom to appear, which the patient describes as obsessive and causing a raw feeling in the throat. The throat pain is located in the middle of the throat, and the patient has experienced a loss of voice over the past week, which worsens at night. The patient also reports difficulty breathing, which has worsened despite attempting to quit vaping. The patient also reports pressure around the nasal bridge and ear pain.  Denies overt fevers and chills.  Physical Exam VITALS: SaO2- 96% (baseline appears to be 99% upon EMR review) HEENT: External auditory canals and tympanic membranes clear bilaterally.  Oropharynx mildly injected without exudate, nasal turbinates mildly swollen and erythematous, more pronounced on the left. NECK: Nontender lymphadenopathy posterior cervical chain of the right. CHEST: Lungs clear to auscultation bilaterally with equal air entry, no overt wheezes, rales, rhonchi.  Results Point-of-care strep  test negative  Assessment and Plan Upper Respiratory Infection Persistent cough, throat pain, hoarseness, and nasal bridge pain for one week. No purulence in the throat and point-of-care strep negative.  Palpable lymphadenopathy. -Prescribe Azithromycin  (Z-Pak). -Prescribe Albuterol  inhaler for use as needed.  (Alternatively can use Airsupra  if covered by insurance) -Prescribe as needed promethazine -DM antitussive, particularly for nighttime use. -Recommend over-the-counter Mucinex to help thin mucus and facilitate clearance. -If no improvement by early next week, consider chest x-ray.  Smoking Cessation Patient has stopped smoking and vaping, but reports worse breathing since cessation. -Encourage continued cessation and discuss alternative stress management strategies with primary care provider, Dr. Rochelle Chu.      Relevant Medications   Albuterol -Budesonide (AIRSUPRA ) 90-80 MCG/ACT AERO   promethazine -dextromethorphan (PROMETHAZINE -DM) 6.25-15 MG/5ML syrup   azithromycin  (ZITHROMAX ) 250 MG tablet   albuterol  (VENTOLIN  HFA) 108 (90 Base) MCG/ACT inhaler   Other Relevant Orders   POCT Rapid Strep A (Completed)     Orders & Medications Medications:  Meds ordered this encounter  Medications   Albuterol -Budesonide (AIRSUPRA ) 90-80 MCG/ACT AERO    Sig: Inhale 2 Inhalations into the lungs as directed. Two inhalations every 20 minutes as needed for up to 3 doses (6 inhalations total); then may administer 2 inhalations every 1 to 4 hours as needed. Do not exceed 12 inhalations in a 24-hour period.    Dispense:  10.7 g    Refill:  1   promethazine -dextromethorphan (PROMETHAZINE -DM) 6.25-15 MG/5ML syrup    Sig: Take 5 mLs by mouth 4 (four) times daily as needed  for cough.    Dispense:  118 mL    Refill:  0   azithromycin  (ZITHROMAX ) 250 MG tablet    Sig: Take 2 tablets on day 1, then 1 tablet daily on days 2 through 5    Dispense:  6 tablet    Refill:  0   albuterol  (VENTOLIN  HFA) 108  (90 Base) MCG/ACT inhaler    Sig: Inhale 2 puffs into the lungs every 6 (six) hours as needed for wheezing.    Dispense:  2 each    Refill:  0    Please use generic pro-air   No orders of the defined types were placed in this encounter.    No follow-ups on file.     Ma Saupe, MD, [facility]   Primary Care Sports Medicine Primary Care and Sports Medicine at [facility]

## 2023-06-01 NOTE — Assessment & Plan Note (Addendum)
 History of Present Illness Adam Gill, a patient with a history of childhood asthma, presents with a 1-2 week-long history of coughing, throat pain, and hoarseness. The coughing was the first symptom to appear, which the patient describes as obsessive and causing a raw feeling in the throat. The throat pain is located in the middle of the throat, and the patient has experienced a loss of voice over the past week, which worsens at night. The patient also reports difficulty breathing, which has worsened despite attempting to quit vaping. The patient also reports pressure around the nasal bridge and ear pain.  Denies overt fevers and chills.  Physical Exam VITALS: SaO2- 96% (baseline appears to be 99% upon EMR review) HEENT: External auditory canals and tympanic membranes clear bilaterally.  Oropharynx mildly injected without exudate, nasal turbinates mildly swollen and erythematous, more pronounced on the left. NECK: Nontender lymphadenopathy posterior cervical chain of the right. CHEST: Lungs clear to auscultation bilaterally with equal air entry, no overt wheezes, rales, rhonchi.  Results Point-of-care strep test negative  Assessment and Plan Upper Respiratory Infection Persistent cough, throat pain, hoarseness, and nasal bridge pain for one week. No purulence in the throat and point-of-care strep negative.  Palpable lymphadenopathy. -Prescribe Azithromycin  (Z-Pak). -Prescribe Albuterol  inhaler for use as needed.  (Alternatively can use Airsupra  if covered by insurance) -Prescribe as needed promethazine -DM antitussive, particularly for nighttime use. -Recommend over-the-counter Mucinex to help thin mucus and facilitate clearance. -If no improvement by early next week, consider chest x-ray.  Smoking Cessation Patient has stopped smoking and vaping, but reports worse breathing since cessation. -Encourage continued cessation and discuss alternative stress management strategies with primary care  provider, Dr. Rochelle Chu.

## 2023-06-01 NOTE — Patient Instructions (Signed)
 Your Care Plan:  - Upper Respiratory Infection:   - Start azithromycin  (Z-Pak), take for full course.   - Use an Albuterol  (or Airsupra ) inhaler as needed to help with breathing.   - Take Rx cough syrup as needed, especially at night, to ease your cough.   - Over-the-counter Mucinex is recommended to help thin mucus.   - If you do not see improvement by early next week, contact us  for next steps.  - Smoking Cessation:   - Quitting smoking and vaping can initially cause your breathing to feel worse as your body adjusts. It is important to continue with cessation.   - Discuss alternative stress management strategies with your primary care provider, Dr. Rochelle Chu, for further support.

## 2023-06-21 ENCOUNTER — Other Ambulatory Visit: Payer: Self-pay | Admitting: Urology

## 2023-06-21 DIAGNOSIS — N50819 Testicular pain, unspecified: Secondary | ICD-10-CM

## 2023-06-21 DIAGNOSIS — I861 Scrotal varices: Secondary | ICD-10-CM

## 2023-06-28 ENCOUNTER — Ambulatory Visit: Payer: No Typology Code available for payment source

## 2023-06-29 ENCOUNTER — Ambulatory Visit
Admission: RE | Admit: 2023-06-29 | Discharge: 2023-06-29 | Disposition: A | Discharge status: Home / Self Care | Payer: No Typology Code available for payment source | Source: Ambulatory Visit | Attending: Urology | Admitting: Urology

## 2023-06-29 DIAGNOSIS — N50819 Testicular pain, unspecified: Secondary | ICD-10-CM | POA: Diagnosis present

## 2023-06-29 DIAGNOSIS — I861 Scrotal varices: Secondary | ICD-10-CM | POA: Insufficient documentation
